# Patient Record
Sex: Male | Born: 1980 | Race: White | Hispanic: No | Marital: Single | Smoking: Current every day smoker
Health system: Southern US, Community
[De-identification: ages and names within clinical notes are randomized; demographics above are authoritative.]

## PROBLEM LIST (undated history)

## (undated) DIAGNOSIS — F909 Attention-deficit hyperactivity disorder, unspecified type: Secondary | ICD-10-CM

---
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
## 2004-08-08 ENCOUNTER — Emergency Department (HOSPITAL_COMMUNITY): Admission: EM | Admit: 2004-08-08 | Discharge: 2004-08-09 | Payer: Self-pay | Admitting: Emergency Medicine

## 2005-03-06 ENCOUNTER — Emergency Department (HOSPITAL_COMMUNITY): Admission: EM | Admit: 2005-03-06 | Discharge: 2005-03-06 | Payer: Self-pay | Admitting: Emergency Medicine

## 2005-05-25 ENCOUNTER — Emergency Department (HOSPITAL_COMMUNITY): Admission: EM | Admit: 2005-05-25 | Discharge: 2005-05-25 | Payer: Self-pay | Admitting: *Deleted

## 2005-11-12 ENCOUNTER — Emergency Department (HOSPITAL_COMMUNITY): Admission: EM | Admit: 2005-11-12 | Discharge: 2005-11-13 | Payer: Self-pay | Admitting: Emergency Medicine

## 2006-02-15 ENCOUNTER — Emergency Department (HOSPITAL_COMMUNITY): Admission: EM | Admit: 2006-02-15 | Discharge: 2006-02-16 | Payer: Self-pay | Admitting: Emergency Medicine

## 2006-07-15 ENCOUNTER — Inpatient Hospital Stay (HOSPITAL_COMMUNITY): Admission: AC | Admit: 2006-07-15 | Discharge: 2006-07-16 | Payer: Self-pay

## 2008-11-29 ENCOUNTER — Emergency Department (HOSPITAL_COMMUNITY): Admission: EM | Admit: 2008-11-29 | Discharge: 2008-11-29 | Payer: Self-pay | Admitting: Family Medicine

## 2012-06-07 ENCOUNTER — Encounter (HOSPITAL_COMMUNITY): Payer: Self-pay

## 2012-06-07 ENCOUNTER — Emergency Department (INDEPENDENT_AMBULATORY_CARE_PROVIDER_SITE_OTHER)
Admission: EM | Admit: 2012-06-07 | Discharge: 2012-06-07 | Disposition: A | Discharge status: Home / Self Care | Payer: BC Managed Care – PPO | Source: Home / Self Care | Attending: Emergency Medicine | Admitting: Emergency Medicine

## 2012-06-07 ENCOUNTER — Emergency Department (INDEPENDENT_AMBULATORY_CARE_PROVIDER_SITE_OTHER): Payer: BC Managed Care – PPO

## 2012-06-07 DIAGNOSIS — S92919A Unspecified fracture of unspecified toe(s), initial encounter for closed fracture: Secondary | ICD-10-CM

## 2012-06-07 MED ORDER — OXYCODONE-ACETAMINOPHEN 5-325 MG PO TABS: 2.0000 | ORAL_TABLET | ORAL | Status: AC | PRN

## 2012-06-07 NOTE — ED Notes (Signed)
States a plate was dropped on his right great toe earlier today; pain , swelling and ecchymosis present

## 2012-06-07 NOTE — ED Provider Notes (Signed)
History     CSN: [id number]  Arrival date & time 06/07/12  1717   First MD Initiated Contact with Patient 06/07/12 1818      Chief Complaint  Patient presents with  . Toe Injury    (Consider location/radiation/quality/duration/timing/severity/associated sxs/prior treatment) Patient is a 31 y.o. male presenting with toe pain. The history is provided by the patient. No language interpreter was used.  Toe Pain This is a new problem. The current episode started yesterday. The problem occurs constantly. The problem has been gradually worsening. The symptoms are aggravated by walking. Nothing relieves the symptoms. He has tried nothing for the symptoms.  Pt complains of pain to his right big toe after dropping a piece of metal on toe  History reviewed. No pertinent past medical history.  History reviewed. No pertinent past surgical history.  History reviewed. No pertinent family history.  History  Substance Use Topics  . Smoking status: Current Everyday Smoker  . Smokeless tobacco: Not on file  . Alcohol Use: Yes      Review of Systems  Musculoskeletal: Positive for joint swelling.  All other systems reviewed and are negative.    Allergies  Vicodin  Home Medications  No current outpatient prescriptions on file.  BP 104/62  Pulse 79  Temp 98 F (36.7 C) (Oral)  Resp 18  SpO2 95%  Physical Exam  Nursing note and vitals reviewed. Constitutional: He is oriented to person, place, and time.  Musculoskeletal: He exhibits tenderness.       Swollen tender right 1st toe,    Neurological: He is alert and oriented to person, place, and time. He has normal reflexes.  Skin: There is erythema.  Psychiatric: He has a normal mood and affect.    ED Course  Procedures (including critical care time)  Labs Reviewed - No data to display No results found.   1. Fracture, toe       MDM  Buddy tape post op shoe        Lonia Skinner [place], [place] 06/07/12 1846  Lonia Skinner  [place], [place] 06/07/12 [phone]

## 2012-06-09 NOTE — ED Provider Notes (Signed)
Medical screening examination/treatment/procedure(s) were performed by non-physician practitioner and as supervising physician I was immediately available for consultation/collaboration.  Gerrald Basu   Despina Boan, MD 06/09/12 1046 

## 2012-08-12 ENCOUNTER — Emergency Department (HOSPITAL_COMMUNITY)
Admission: EM | Admit: 2012-08-12 | Discharge: 2012-08-12 | Disposition: A | Discharge status: Home / Self Care | Payer: BC Managed Care – PPO | Attending: Emergency Medicine | Admitting: Emergency Medicine

## 2012-08-12 ENCOUNTER — Encounter (HOSPITAL_COMMUNITY): Payer: Self-pay | Admitting: *Deleted

## 2012-08-12 DIAGNOSIS — F1110 Opioid abuse, uncomplicated: Secondary | ICD-10-CM | POA: Insufficient documentation

## 2012-08-12 DIAGNOSIS — F1490 Cocaine use, unspecified, uncomplicated: Secondary | ICD-10-CM

## 2012-08-12 DIAGNOSIS — F909 Attention-deficit hyperactivity disorder, unspecified type: Secondary | ICD-10-CM | POA: Insufficient documentation

## 2012-08-12 DIAGNOSIS — F17200 Nicotine dependence, unspecified, uncomplicated: Secondary | ICD-10-CM | POA: Insufficient documentation

## 2012-08-12 DIAGNOSIS — F1310 Sedative, hypnotic or anxiolytic abuse, uncomplicated: Secondary | ICD-10-CM | POA: Insufficient documentation

## 2012-08-12 DIAGNOSIS — F1410 Cocaine abuse, uncomplicated: Secondary | ICD-10-CM | POA: Insufficient documentation

## 2012-08-12 HISTORY — DX: Attention-deficit hyperactivity disorder, unspecified type: F90.9

## 2012-08-12 LAB — BASIC METABOLIC PANEL
BUN: 20 mg/dL (ref 6–23)
Chloride: 105 mEq/L (ref 96–112)
Creatinine, Ser: 0.88 mg/dL (ref 0.50–1.35)
GFR calc Af Amer: >90 mL/min (ref >90)
GFR calc non Af Amer: >90 mL/min (ref >90)

## 2012-08-12 LAB — RAPID URINE DRUG SCREEN, HOSP PERFORMED
Amphetamines: NOT DETECTED
Benzodiazepines: NOT DETECTED
Cocaine: POSITIVE — AB
Opiates: NOT DETECTED

## 2012-08-12 LAB — ETHANOL: Alcohol, Ethyl (B): <11 mg/dL (ref 0–11)

## 2012-08-12 LAB — CBC WITH DIFFERENTIAL/PLATELET
Basophils Absolute: 0 10*3/uL (ref 0.0–0.1)
Basophils Relative: 0 % (ref 0–1)
Lymphocytes Relative: 29 % (ref 12–46)
MCHC: 33.5 g/dL (ref 30.0–36.0)
Neutro Abs: 4.9 10*3/uL (ref 1.7–7.7)
Neutrophils Relative %: 59 % (ref 43–77)
Platelets: 198 10*3/uL (ref 150–400)
RDW: 13 % (ref 11.5–15.5)
WBC: 8.3 10*3/uL (ref 4.0–10.5)

## 2012-08-12 LAB — TROPONIN I: Troponin I: <0.3 ng/mL (ref <0.30)

## 2012-08-12 LAB — POCT I-STAT TROPONIN I: Troponin i, poc: 0.02 ng/mL (ref 0.00–0.08)

## 2012-08-12 MED ORDER — NICOTINE 21 MG/24HR TD PT24: 21.0000 mg | MEDICATED_PATCH | Freq: Every day | TRANSDERMAL | Status: DC

## 2012-08-12 MED ORDER — IBUPROFEN 200 MG PO TABS: 600.0000 mg | ORAL_TABLET | Freq: Three times a day (TID) | ORAL | Status: DC | PRN

## 2012-08-12 MED ORDER — ACETAMINOPHEN 325 MG PO TABS
650.0000 mg | ORAL_TABLET | ORAL | Status: DC | PRN
Start: 2012-08-12 — End: 2012-08-13

## 2012-08-12 MED ORDER — ZOLPIDEM TARTRATE 5 MG PO TABS: 5.0000 mg | ORAL_TABLET | Freq: Every evening | ORAL | Status: DC | PRN

## 2012-08-12 MED ORDER — ONDANSETRON HCL 8 MG PO TABS: 4.0000 mg | ORAL_TABLET | Freq: Three times a day (TID) | ORAL | Status: DC | PRN

## 2012-08-12 NOTE — ED Notes (Signed)
Regular Diet Ordered spoke with Monica  

## 2012-08-12 NOTE — ED Notes (Signed)
Pt placed in paper scrubs. All of pt's belongings given to mother to take home. Nothing left here with pt in ED

## 2012-08-12 NOTE — ED Notes (Signed)
Pt denies si/hi and is here for medical clearance to go to [facility] to detox from crack cocaine which last use was Friday and detox from Xanax ( Last use Friday), last use on percocet Thursday.

## 2012-08-12 NOTE — ED Notes (Signed)
Pt states he came to the ED for medical clearance so he can start detox program.

## 2012-08-12 NOTE — ED Provider Notes (Signed)
Medical screening examination/treatment/procedure(s) were performed by non-physician practitioner and as supervising physician I was immediately available for consultation/collaboration.  Everette Dimauro, MD 08/12/12 2329 

## 2012-08-12 NOTE — ED Notes (Signed)
Pt A.O. X. 4. Denies SI. Denies HI. Denies Auditory and visual hallucinations. Denies pain. Denies SOB. Respirations even and regular. Vitals stable. Skin warm and dry. NAD. States his belonging were sent home with his mother prior to rooming in [facility]. Verbalized understanding of discharge instructions. Able to locate information needed to follow up with [facility]. No further questions at this time.

## 2012-08-12 NOTE — ED Notes (Signed)
Requesting detox from crack/cocaine, xanax & percocet. Daily crack use x 4 months, avg "4 rocks" daily. Average xanax & percocet use was twice weekly. Denies ETOH abuse. Reports last used everything on Friday. States spoke with [facility] &, was instructed to go to ED for medical clearance. Denies SI, HI, has never been to detox/rehab before.  States had SSCP for approx 45 min which resolved on its own shortly upon arrival to ED. Denied any SOB, n/v, diaphoresis with CP.

## 2012-08-12 NOTE — ED Provider Notes (Signed)
History     CSN: [id number]  Arrival date & time 08/12/12  1257   First MD Initiated Contact with Patient 08/12/12 1612      No chief complaint on file.   (Consider location/radiation/quality/duration/timing/severity/associated sxs/prior treatment) HPI  Pt to the ER from [facility] requesting detox for crack cocaine. He uses percocet's and xanax but infrequently. His last use of cocaine was on Friday. He used everyday for the past 4 months. He denies SI/HI. No ETOH abuse.  Upon arrival he had a chest pain that lasted 30 minutes and was substernal. He said that he has had this before when anxious. He denies any N,V,D, or diaphoresis, weakness or loc. He is currently pain free.    History reviewed. No pertinent past medical history.  History reviewed. No pertinent past surgical history.  No family history on file.  History  Substance Use Topics  . Smoking status: Current Every Day Smoker  . Smokeless tobacco: Not on file  . Alcohol Use: Yes     occ      Review of Systems  Review of Systems  Gen: no weight loss, fevers, chills, night sweats  Eyes: no discharge or drainage, no occular pain or visual changes  Nose: no epistaxis or rhinorrhea  Mouth: no dental pain, no sore throat  Neck: no neck pain  Lungs:No wheezing, coughing or hemoptysis CV: no chest pain, palpitations, dependent edema or orthopnea  Abd: no abdominal pain, nausea, vomiting  GU: no dysuria or gross hematuria  MSK:  No abnormalities  Neuro: no headache, no focal neurologic deficits  Skin: no abnormalities Psyche: substance abuse   Allergies  Vicodin  Home Medications  No current outpatient prescriptions on file.  BP 136/66  Pulse 94  Temp 98.6 F (37 C) (Oral)  Resp 18  SpO2 99%  Physical Exam  Nursing note and vitals reviewed. Constitutional: He appears well-developed and well-nourished. No distress.  HENT:  Head: Normocephalic and atraumatic.  Eyes: Pupils are equal, round, and  reactive to light.  Neck: Normal range of motion. Neck supple.  Cardiovascular: Normal rate and regular rhythm.   Pulmonary/Chest: Effort normal.  Abdominal: Soft.  Neurological: He is alert.  Skin: Skin is warm and dry.  Psychiatric: He expresses no homicidal and no suicidal ideation. He expresses no suicidal plans and no homicidal plans.    ED Course  Procedures (including critical care time)  Labs Reviewed  URINE RAPID DRUG SCREEN (HOSP PERFORMED) - Abnormal; Notable for the following:    Cocaine POSITIVE (*)     All other components within normal limits  SALICYLATE LEVEL - Abnormal; Notable for the following:    Salicylate Lvl <2.0 (*)     All other components within normal limits  BASIC METABOLIC PANEL  CBC WITH DIFFERENTIAL  ETHANOL  ACETAMINOPHEN LEVEL  POCT I-STAT TROPONIN I  TROPONIN I   No results found.   1. Crack cocaine use       MDM   Date: 08/12/2012  Rate: 86  Rhythm: normal sinus rhythm  QRS Axis: normal  Intervals: PR shortened  ST/T Wave abnormalities: normal  Conduction Disutrbances:incomplete right bundle branch block  Narrative Interpretation:   Old EKG Reviewed: none available    Pt medically cleared. Two negative Troponins. ACT team consulted. Holding orders placed.          Dorthula Matas, PA 08/12/12 1724

## 2012-08-12 NOTE — BH Assessment (Signed)
Assessment Note   Chris Cook is an 31 y.o. male.  Patient came to [facility] seeking medical clearance prior to applying for a rehabilitation bed at [facility].  Patient said that he had called [facility] earlier in the day and was told he would need medical clearance.  Patient denies any HI, SI or A/V hallucinations.  He does report daily use of crack over the last 3-4 months.  He is spending an average of $100 per day on it with last use being 10/25.  Patient also reports using pain pills (xanax, percocets, oxycodone) use at least 3x/W.  Patient reports last use was 10/25.  Patient has not had previous inpatient care.  He has had outpatient psychiatric care which was court ordered about two years ago.  Pt cannot recall name of provider at that time.  This clinician called [facility] and was informed that patient can call them in the morning and ask to speak to [employer].  This was discussed with Dr. Nino Parsley [facility]) who agreed that patient did not meet inpatient mental health criteria and could be discharged home.  Patient was fine with going home and will contact [facility] in the AM tomorrow.  Patient was given contact information for [facility].  Patient discharged home. Axis I: Substance Abuse Axis II: Deferred Axis III:  Past Medical History  Diagnosis Date  . ADHD (attention deficit hyperactivity disorder)    Axis IV: problems related to legal system/crime Axis V: 51-60 moderate symptoms  Past Medical History:  Past Medical History  Diagnosis Date  . ADHD (attention deficit hyperactivity disorder)     History reviewed. No pertinent past surgical history.  Family History: History reviewed. No pertinent family history.  Social History:  reports that he has been smoking.  He does not have any smokeless tobacco history on file. He reports that he drinks alcohol. He reports that he uses illicit drugs (Cocaine).  Additional Social History:  Alcohol / Drug Use Pain Medications: Patient admits to abusing xanax,  percocets and oxycodone Prescriptions: None at this time Over the Counter: N/A History of alcohol / drug use?: Yes Substance #1 Name of Substance 1: Pain pills (xanax, percocets & oxycodone) 1 - Age of First Use: 31 years old 1 - Amount (size/oz): Reports that he takes 1-2 pills daily (14 mg approximately daily) 1 - Frequency: Daily use 1 - Duration: Over the last 3-4 months 1 - Last Use / Amount: 10/25 in the evening used 3 xanax Substance #2 Name of Substance 2: Crack cocaine 2 - Age of First Use: 31 years of age 26 - Amount (size/oz): uses around 4 rocks, or $100 per day. 2 - Frequency: Daily use  2 - Duration: Over the last 3-4 months 2 - Last Use / Amount: 10/25 in the evening.  CIWA: CIWA-Ar BP: 100/59 mmHg Pulse Rate: 77  COWS:    Allergies:  Allergies  Allergen Reactions  . Vicodin (Hydrocodone-Acetaminophen) Itching and Nausea And Vomiting    Home Medications:  (Not in a hospital admission)  OB/GYN Status:  No LMP for male patient.  General Assessment Data Location of Assessment: [facility] ED Living Arrangements: Parent (staying with mother) Can pt return to current living arrangement?: Yes Admission Status: Voluntary Is patient capable of signing voluntary admission?: Yes Transfer from: Acute Hospital Referral Source: Self/Family/Friend  Education Status Highest grade of school patient has completed: 1.5 years of technical school  Risk to self Suicidal Ideation: No Suicidal Intent: No Is patient at risk for suicide?:  No Suicidal Plan?: No Access to Means: No What has been your use of drugs/alcohol within the last 12 months?: Regular use of opiates and crack Previous Attempts/Gestures: Yes How many times?: 1  Other Self Harm Risks: On-going SA issues Triggers for Past Attempts:  (Conflict with baby's mamma.) Intentional Self Injurious Behavior: None Family Suicide History: No Recent stressful life event(s): Legal Issues Persecutory voices/beliefs?:  No Depression: Yes Depression Symptoms: Isolating;Feeling worthless/self pity Substance abuse history and/or treatment for substance abuse?: Yes Suicide prevention information given to non-admitted patients: Not applicable  Risk to Others Homicidal Ideation: No Thoughts of Harm to Others: No Current Homicidal Intent: No Current Homicidal Plan: No Access to Homicidal Means: No Identified Victim: No one History of harm to others?: No Assessment of Violence: None Noted Violent Behavior Description: Pt calm and cooperative Does patient have access to weapons?: No Criminal Charges Pending?: Yes Describe Pending Criminal Charges: Possession of stolen goods and paraphenalia Does patient have a court date: Yes Court Date:  (November date for paraphenalia)  Psychosis Hallucinations: None noted Delusions: None noted  Mental Status Report Appear/Hygiene:  (Casual) Eye Contact: Fair Motor Activity: Freedom of movement;Unremarkable Speech: Logical/coherent Level of Consciousness: Alert Mood: Depressed Affect: Anxious Anxiety Level: Minimal Thought Processes: Coherent;Relevant Judgement: Unimpaired Orientation: Person;Place;Time;Situation Obsessive Compulsive Thoughts/Behaviors: None  Cognitive Functioning Concentration: Decreased Memory: Remote Impaired;Recent Intact IQ: Average Insight: Fair Impulse Control: Poor Appetite: Fair Weight Loss: 15  Weight Gain: 0  Sleep: No Change Total Hours of Sleep: 8  Vegetative Symptoms: Staying in bed  ADLScreening [facility]) Patient's cognitive ability adequate to safely complete daily activities?: Yes Patient able to express need for assistance with ADLs?: Yes Independently performs ADLs?: Yes (appropriate for developmental age)  Abuse/Neglect [facility]) Physical Abuse: Denies Verbal Abuse: Denies Sexual Abuse: Denies  Prior Inpatient Therapy Prior Inpatient Therapy: No Prior Therapy Dates: N/A Prior Therapy  Facilty/Provider(s): N/A Reason for Treatment: N/A  Prior Outpatient Therapy Prior Outpatient Therapy: Yes Prior Therapy Dates: 2 years ago Prior Therapy Facilty/Provider(s): Pt cannot recall provider Reason for Treatment: Court ordered  ADL Screening (condition at time of admission) Patient's cognitive ability adequate to safely complete daily activities?: Yes Patient able to express need for assistance with ADLs?: Yes Independently performs ADLs?: Yes (appropriate for developmental age) Weakness of Legs: None Weakness of Arms/Hands: None  Home Assistive Devices/Equipment Home Assistive Devices/Equipment: None    Abuse/Neglect Assessment (Assessment to be complete while patient is alone) Physical Abuse: Denies Verbal Abuse: Denies Sexual Abuse: Denies Exploitation of patient/patient's resources: Denies Self-Neglect: Denies Values / Beliefs Cultural Requests During Hospitalization: None Spiritual Requests During Hospitalization: None   Advance Directives (For Healthcare) Advance Directive: Patient does not have advance directive;Patient would not like information    Additional Information 1:1 In Past 12 Months?: No CIRT Risk: No Elopement Risk: No Does patient have medical clearance?: Yes     Disposition:  Disposition Disposition of Patient: Outpatient treatment;Referred to Type of outpatient treatment: Adult Patient referred to: [facility]  On Site Evaluation by:   Reviewed with Physician:  Dr. Marya Fossa, Berna Spare Ray 08/12/2012 9:22 PM

## 2012-08-12 NOTE — ED Notes (Signed)
Report received from Monica, RN.

## 2012-08-12 NOTE — ED Notes (Signed)
Contact numbers Silvio Pate (mother) Home: [phone] Cell: [phone] Work: [phone]

## 2012-08-12 NOTE — ED Notes (Signed)
Mother Chris Cook called to pick her son up. After discharge.

## 2012-08-12 NOTE — ED Notes (Signed)
Pt reports chest pain that started one hour ago to left chest and no radiation

## 2012-08-12 NOTE — ED Notes (Signed)
Berna Spare, ACT Team at bedside.

## 2012-08-12 NOTE — ED Notes (Signed)
Pt A.O. X 4. Sitting up in bed watching tv. Respirations even and regular. Skin warm and dry. Denies pain. Denies N/V/D/C. Denies anxiety. Denies Visual or auditory hallucinations. No further needs at this time.

## 2012-09-02 ENCOUNTER — Ambulatory Visit (HOSPITAL_COMMUNITY)
Admission: RE | Admit: 2012-09-02 | Discharge: 2012-09-02 | Disposition: A | Discharge status: Home / Self Care | Payer: PRIVATE HEALTH INSURANCE | Attending: Psychiatry | Admitting: Psychiatry

## 2012-09-02 ENCOUNTER — Other Ambulatory Visit (HOSPITAL_COMMUNITY): Payer: PRIVATE HEALTH INSURANCE | Attending: Psychiatry | Admitting: Psychology

## 2012-09-02 ENCOUNTER — Encounter (HOSPITAL_COMMUNITY): Payer: Self-pay | Admitting: Psychology

## 2012-09-02 DIAGNOSIS — F1420 Cocaine dependence, uncomplicated: Secondary | ICD-10-CM | POA: Insufficient documentation

## 2012-09-02 DIAGNOSIS — F1310 Sedative, hypnotic or anxiolytic abuse, uncomplicated: Secondary | ICD-10-CM

## 2012-09-02 DIAGNOSIS — F1110 Opioid abuse, uncomplicated: Secondary | ICD-10-CM | POA: Insufficient documentation

## 2012-09-02 DIAGNOSIS — F909 Attention-deficit hyperactivity disorder, unspecified type: Secondary | ICD-10-CM | POA: Insufficient documentation

## 2012-09-02 DIAGNOSIS — F1410 Cocaine abuse, uncomplicated: Secondary | ICD-10-CM | POA: Insufficient documentation

## 2012-09-02 NOTE — BH Assessment (Signed)
Assessment Note   Chris Cook is an 31 y.o. male that presented alone requesting admission to [facility] IOP for additional sa treatment. Pt was oriented x's 4, calm, cooperative and Pt was released from [facility] (08/14/12-08/28/12) after 14 days of treatment and decided that he "want more treatment" and stability in his recovery. Pt confirms his sa hx includes cocaine (crack), opioids (xanax, percocet, klonipin), cannabis, etoh, nicotine (1pk daily). He denies SI, HI, A/V hallucinations, delusions or psychosis, as well as any sexual, physical or emotional abuse. Pt confirms that his recent stressors are that he has only used substances for the last 4 months (in his life) and the using is effecting his relationships (creating distance) and finances "I stopped paying my bills", legal (stolen prop & paraphernalia). Pt reports that his dad is nearing the end of his life battleing throat and lung ca; and still dad, mom and older sister are his natural supports. Pt reports his only withdrawal symptom is tremors (in his hands).   Axis I: 305.60 Cocaine Abuse Axis II: Deferred Axis III:  Past Medical History  Diagnosis Date  . ADHD (attention deficit hyperactivity disorder)    Axis IV: economic problems, problems related to legal system/crime, problems related to social environment and problems with primary support group Axis V: 41-50 serious symptoms  Past Medical History:  Past Medical History  Diagnosis Date  . ADHD (attention deficit hyperactivity disorder)     No past surgical history on file.  Family History: No family history on file.  Social History:  reports that he has been smoking.  He does not have any smokeless tobacco history on file. He reports that he drinks alcohol. He reports that he uses illicit drugs (Cocaine).  Additional Social History:  Alcohol / Drug Use Pain Medications: Xanax, Percocets, Klonipine Prescriptions: none Over the Counter: none History of alcohol / drug use?:  Yes Negative Consequences of Use: Financial;Personal relationships (pt reports not paying bills, relationships effected) Withdrawal Symptoms: Tremors (pt tremors in hands) Substance #1 Name of Substance 1: Xanax, Percocet, Klonipine 1 - Age of First Use: 30 years 1 - Amount (size/oz): 1-2 pills daily 1 - Frequency: daily 1 - Duration: last 4 months 1 - Last Use / Amount: unk Substance #2 Name of Substance 2: crack cocaine 2 - Age of First Use: 30 years 2 - Amount (size/oz): 4-5 rocks 2 - Frequency: daily 2 - Duration: 4 months  CIWA: CIWA-Ar Nausea and Vomiting: no nausea and no vomiting Tactile Disturbances: none Tremor: two Auditory Disturbances: not present Paroxysmal Sweats: no sweat visible Visual Disturbances: not present Anxiety: mildly anxious Headache, Fullness in Head: none present Agitation: normal activity Orientation and Clouding of Sensorium: oriented and can do serial additions CIWA-Ar Total: 3  COWS: Clinical Opiate Withdrawal Scale (COWS) Resting Pulse Rate: Pulse Rate 80 or below Sweating: No report of chills or flushing Restlessness: Able to sit still Pupil Size: Pupils pinned or normal size for room light Bone or Joint Aches: Not present Runny Nose or Tearing: Not present GI Upset: No GI symptoms Tremor: Tremor can be felt, but not observed Yawning: No yawning Anxiety or Irritability: None Gooseflesh Skin: Skin is smooth COWS Total Score: 1   Allergies:  Allergies  Allergen Reactions  . Vicodin (Hydrocodone-Acetaminophen) Itching and Nausea And Vomiting    Home Medications:  (Not in a hospital admission)  OB/GYN Status:  No LMP for male patient.  General Assessment Data Location of Assessment: [facility] Assessment Services Living Arrangements: Parent  Can pt return to current living arrangement?: Yes Admission Status: Voluntary Is patient capable of signing voluntary admission?: Yes Transfer from: Home Referral Source:  Self/Family/Friend  Education Status Is patient currently in school?: No  Risk to self Suicidal Ideation: No Suicidal Intent: No Is patient at risk for suicide?: No Suicidal Plan?: No Access to Means: No What has been your use of drugs/alcohol within the last 12 months?: opiates and crack Previous Attempts/Gestures: No How many times?:  (0) Other Self Harm Risks:  (SA issues) Triggers for Past Attempts: Other personal contacts Intentional Self Injurious Behavior: None Family Suicide History: No Recent stressful life event(s): Loss (Comment);Financial Problems (dad near end of life (throat & lung ca), not paying bills) Persecutory voices/beliefs?: No Depression: No Depression Symptoms: Loss of interest in usual pleasures (pt denies any other symptoms) Substance abuse history and/or treatment for substance abuse?: Yes Suicide prevention information given to non-admitted patients: Not applicable  Risk to Others Homicidal Ideation: No Thoughts of Harm to Others: No Current Homicidal Intent: No Current Homicidal Plan: No Access to Homicidal Means: No Identified Victim:  (none) History of harm to others?: No Assessment of Violence: None Noted Violent Behavior Description:  (calm and cooperative) Does patient have access to weapons?: No Criminal Charges Pending?: Yes Describe Pending Criminal Charges:  (poss of stolen goods and drug paraphenalia) Does patient have a court date: Yes Court Date:  (pt reports that it's taken care of as of 09/02/12 )  Psychosis Hallucinations: None noted Delusions: None noted  Mental Status Report Appear/Hygiene:  (appropriate) Eye Contact: Fair Motor Activity: Freedom of movement Speech: Logical/coherent Level of Consciousness: Alert Mood: Depressed Affect: Appropriate to circumstance Anxiety Level: Minimal Thought Processes: Coherent;Relevant Judgement: Unimpaired Orientation: Person;Place;Time;Situation Obsessive Compulsive  Thoughts/Behaviors: None  Cognitive Functioning Concentration: Decreased Memory: Remote Intact;Recent Impaired IQ: Average Insight: Fair Impulse Control: Poor Appetite: Good Weight Loss:  (0) Weight Gain:  (0) Sleep: No Change Total Hours of Sleep:  (8) Vegetative Symptoms: None  ADLScreening [facility] Assessment Services) Patient's cognitive ability adequate to safely complete daily activities?: Yes Patient able to express need for assistance with ADLs?: Yes Independently performs ADLs?: Yes (appropriate for developmental age)  Abuse/Neglect [facility]) Physical Abuse: Denies Verbal Abuse: Denies Sexual Abuse: Denies  Prior Inpatient Therapy Prior Inpatient Therapy: Yes Prior Therapy Dates:  (08/14/12-08/28/12) Prior Therapy Facilty/Provider(s):  ([facility]) Reason for Treatment:  (cocaine, opiates)  Prior Outpatient Therapy Prior Outpatient Therapy: Yes Prior Therapy Dates: 2 years ago Prior Therapy Facilty/Provider(s): Pt cannot recall provider Reason for Treatment: Court ordered  ADL Screening (condition at time of admission) Patient's cognitive ability adequate to safely complete daily activities?: Yes Patient able to express need for assistance with ADLs?: Yes Independently performs ADLs?: Yes (appropriate for developmental age) Weakness of Legs: None Weakness of Arms/Hands: None  Home Assistive Devices/Equipment Home Assistive Devices/Equipment: None  Therapy Consults (therapy consults require a physician order) PT Evaluation Needed: No OT Evalulation Needed: No SLP Evaluation Needed: No Abuse/Neglect Assessment (Assessment to be complete while patient is alone) Physical Abuse: Denies Verbal Abuse: Denies Sexual Abuse: Denies Exploitation of patient/patient's resources: Denies Self-Neglect: Denies Values / Beliefs Cultural Requests During Hospitalization: None Spiritual Requests During Hospitalization: None Consults Spiritual Care Consult Needed: No Social Work  Consult Needed: No Merchant navy officer (For Healthcare) Advance Directive: Patient does not have advance directive Pre-existing out of facility DNR order (yellow form or pink MOST form): No Nutrition Screen- [facility] Adult/WL/AP Patient's home diet: Regular Have you recently lost weight without trying?: No  Have you been eating poorly because of a decreased appetite?: No Malnutrition Screening Tool Score: 0   Additional Information 1:1 In Past 12 Months?: No CIRT Risk: No Elopement Risk: No Does patient have medical clearance?: No     Disposition: Pt accepted to [facility] IOP, starting date 09/02/12 at 1:00 pm, with Charmian Muff, LCAS, NCC, MA. Disposition Disposition of Patient: Outpatient treatment Type of outpatient treatment: Adult Patient referred to: Other (Comment) ([facility] CD IOP)  On Site Evaluation by:   Reviewed with Physician:     Ranae Pila A 09/02/2012 1:01 PM

## 2012-09-03 ENCOUNTER — Encounter (HOSPITAL_COMMUNITY): Payer: Self-pay | Admitting: Psychology

## 2012-09-04 ENCOUNTER — Other Ambulatory Visit (HOSPITAL_COMMUNITY): Payer: PRIVATE HEALTH INSURANCE | Admitting: Psychology

## 2012-09-04 DIAGNOSIS — F1420 Cocaine dependence, uncomplicated: Secondary | ICD-10-CM

## 2012-09-04 DIAGNOSIS — F1310 Sedative, hypnotic or anxiolytic abuse, uncomplicated: Secondary | ICD-10-CM | POA: Insufficient documentation

## 2012-09-04 DIAGNOSIS — F1110 Opioid abuse, uncomplicated: Secondary | ICD-10-CM | POA: Insufficient documentation

## 2012-09-04 DIAGNOSIS — F1410 Cocaine abuse, uncomplicated: Secondary | ICD-10-CM | POA: Insufficient documentation

## 2012-09-04 DIAGNOSIS — F1920 Other psychoactive substance dependence, uncomplicated: Secondary | ICD-10-CM

## 2012-09-04 NOTE — Progress Notes (Unsigned)
    Daily Group Progress Note  Program: CD-IOP   Group Time: 1-2:30 pm  Participation Level: Minimal  Behavioral Response: Sharing  Type of Therapy: Process Group  Topic: Group Process/Guest Speaker: Group began with a brief check-in. Members shared about the past weekend and what actions they did to support their recovery.  While some had attended 12-step meetings, others did not. Those that did not attend any meetings had plenty of excuses. A guest speaker arrived in the form of a previous group member who had graduated from the program some time ago. He joined the session and described what he had done once he recognized his drinking was out of control. He was extremely open and honest about his addiction and pointed out the mistakes he had made in early recovery. The patient displayed a very good understanding of the concepts behind the 12-steps and encouraged group members to attend meetings, secure a sponsor and read the Big Book. His visit proved very inspiring and encouraging to the group.   Group Time: 2:45- 4pm  Participation Level: Minimal  Behavioral Response: Sharing  Type of Therapy: Psycho-education Group  Topic: Codependence - Part I: A presentation was provided on the issue of codependence. Handouts were provided and the group read examples of codependent behaviors and shared their own history and codependent behaviors. There were many 'confessions' of insecurity, mind-reading, and destructive sexual patterns disclosed. Almost everyone agreed that they had often confused love and sex. There was a good discussion among group members and the session proved very informative.   Summary: The patient was new to the group and explained he had been abusing drugs. He reported he had gone into [facility] on October 30th and gotten out on the 13th. He admitted he had had alcohol on the 16th. His sobriety date is 11/17. The patient was quiet and reserved for most of the remainder of the  session. He had told me in the orientation that he isn't very talkative. He was attentive during the visiting speaker's comments. He did well for this first group session considering he is very shy. He will be invited to share more in the sessions to come.     Family Program: Family present? No   Name of family member(s):   UDS collected: No Results:   AA/NA attended?: No, patient is new to the group  Sponsor?: No   Olliver Boyadjian, LCAS

## 2012-09-05 ENCOUNTER — Encounter (HOSPITAL_COMMUNITY): Payer: Self-pay | Admitting: Psychology

## 2012-09-05 DIAGNOSIS — F1420 Cocaine dependence, uncomplicated: Secondary | ICD-10-CM | POA: Insufficient documentation

## 2012-09-05 LAB — PRESCRIPTION ABUSE MONITORING 17P, URINE
Amphetamine/Meth: NEGATIVE ng/mL
Barbiturate Screen, Urine: NEGATIVE ng/mL
Buprenorphine, Urine: NEGATIVE ng/mL
Cannabinoid Scrn, Ur: NEGATIVE ng/mL
Carisoprodol, Urine: NEGATIVE ng/mL
Cocaine Metabolites: NEGATIVE ng/mL
Fentanyl, Ur: NEGATIVE ng/mL
Opiate Screen, Urine: NEGATIVE ng/mL
Oxycodone Screen, Ur: NEGATIVE ng/mL

## 2012-09-05 LAB — ALCOHOL METABOLITE (ETG), URINE: Ethyl Glucuronide (EtG): NEGATIVE ng/mL
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
## 2012-09-06 ENCOUNTER — Encounter (HOSPITAL_COMMUNITY): Payer: Self-pay | Admitting: Psychology

## 2012-09-06 ENCOUNTER — Other Ambulatory Visit (HOSPITAL_COMMUNITY): Payer: PRIVATE HEALTH INSURANCE | Admitting: Psychology

## 2012-09-06 DIAGNOSIS — F1420 Cocaine dependence, uncomplicated: Secondary | ICD-10-CM

## 2012-09-06 MED ORDER — SERTRALINE HCL 50 MG PO TABS: ORAL_TABLET | ORAL | Status: AC
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
## 2012-09-09 ENCOUNTER — Other Ambulatory Visit (HOSPITAL_COMMUNITY): Payer: PRIVATE HEALTH INSURANCE | Admitting: Psychology

## 2012-09-09 DIAGNOSIS — F1420 Cocaine dependence, uncomplicated: Secondary | ICD-10-CM

## 2012-09-11 ENCOUNTER — Other Ambulatory Visit (HOSPITAL_COMMUNITY): Payer: PRIVATE HEALTH INSURANCE

## 2012-09-13 ENCOUNTER — Other Ambulatory Visit (HOSPITAL_COMMUNITY): Payer: PRIVATE HEALTH INSURANCE

## 2012-09-16 ENCOUNTER — Other Ambulatory Visit (HOSPITAL_COMMUNITY): Payer: PRIVATE HEALTH INSURANCE | Attending: Psychiatry | Admitting: Psychology

## 2012-09-16 DIAGNOSIS — F1420 Cocaine dependence, uncomplicated: Secondary | ICD-10-CM

## 2012-09-16 DIAGNOSIS — F1110 Opioid abuse, uncomplicated: Secondary | ICD-10-CM | POA: Insufficient documentation

## 2012-09-16 DIAGNOSIS — F1920 Other psychoactive substance dependence, uncomplicated: Secondary | ICD-10-CM

## 2012-09-16 DIAGNOSIS — F1310 Sedative, hypnotic or anxiolytic abuse, uncomplicated: Secondary | ICD-10-CM | POA: Insufficient documentation

## 2012-09-17 ENCOUNTER — Encounter (HOSPITAL_COMMUNITY): Payer: Self-pay | Admitting: Psychology

## 2012-09-17 LAB — PRESCRIPTION ABUSE MONITORING 17P, URINE
Amphetamine/Meth: NEGATIVE ng/mL
Benzodiazepine Screen, Urine: NEGATIVE ng/mL
Buprenorphine, Urine: NEGATIVE ng/mL
Cannabinoid Scrn, Ur: NEGATIVE ng/mL
Carisoprodol, Urine: NEGATIVE ng/mL
Cocaine Metabolites: NEGATIVE ng/mL
Fentanyl, Ur: NEGATIVE ng/mL
Meperidine, Ur: NEGATIVE ng/mL
Tapentadol, urine: NEGATIVE ng/mL
Tramadol Scrn, Ur: NEGATIVE ng/mL

## 2012-09-17 LAB — ALCOHOL METABOLITE (ETG), URINE: Ethyl Glucuronide (EtG): NEGATIVE ng/mL

## 2012-09-17 NOTE — Progress Notes (Signed)
    Daily Group Progress Note  Program: CD-IOP   Group Time: 1-2:30 pm  Participation Level: Minimal  Behavioral Response: only shared when questioned  Type of Therapy: Process Group  Topic: Group Process: First part of group was spent in process. Members shared about the past holiday weekend and the ways they practiced new coping skills in order to remain alcohol and drug-free. They recounted the frustrations and temptations in early recovery. One member had a new sobriety date and shared about her relapse on cannabis. This relapse was processed with discussion on what she might have done instead of smoked.  There was good disclosure among group members and constructive feedback provided.   Group Time: 2:45- 4pm  Participation Level: Minimal  Behavioral Response: patient does not voluntarily offer information about himself   Type of Therapy: Psycho-education Group  Topic: Resentments: A psycho-ed piece on resentments was provided. Handouts were distributed and a member read from the handouts. The importance of addressing anger and hurt feelings was emphasized since resentments develop out of unresolved anger and bad feelings. Members shared how there are benefits as well as losses associated with resentments. Members talked about some of their resentments and the group discussed how they might go about addressing their resentments.   Summary: The patient reported he had spent most of the weekend at his father's house. He noted that his father has lung cancer. Upon further questioning, he reported he had gone deer hunting and had gotten a deer. His uncle was going to can the meat. The patient shared nothing else of his life or feelings. I questioned his failure to share that he had a daughter. I had read the notes from last week written by the substitute counselor. The patient reported he had an 30 yo daughter named Torie, but he had not been able to see her this holiday. He displays poor  insight and even less motivation. It does not appear that this group member is appropriate based on his lack of engagement despite repeated instructions to share about himself.   Family Program: Family present? No   Name of family member(s):   UDS collected: Yes Results: negative  AA/NA attended?: No, not since last week  Sponsor?: No   Cordon Gassett, LCAS

## 2012-09-18 ENCOUNTER — Other Ambulatory Visit (HOSPITAL_COMMUNITY): Payer: PRIVATE HEALTH INSURANCE

## 2012-09-18 ENCOUNTER — Encounter (HOSPITAL_COMMUNITY): Payer: Self-pay | Admitting: Psychology

## 2012-09-19 ENCOUNTER — Ambulatory Visit (HOSPITAL_COMMUNITY): Payer: BC Managed Care – PPO | Admitting: Psychiatry

## 2012-09-19 NOTE — Progress Notes (Unsigned)
Patient ID: Chris Cook, male   DOB: 1980/12/14, 31 y.o.   MRN: [id number] Patient was discharged from the CD-IOP today. Please see discharge summary and plan in Epic. Charmian Muff, LCAS

## 2012-09-20 ENCOUNTER — Other Ambulatory Visit (HOSPITAL_COMMUNITY): Payer: PRIVATE HEALTH INSURANCE

## 2012-09-20 ENCOUNTER — Encounter (HOSPITAL_COMMUNITY): Payer: Self-pay | Admitting: Psychology

## 2012-09-20 NOTE — Progress Notes (Unsigned)
    Daily Group Progress Note  Program: CD-IOP   Group Time: 1-2:30 pm  Participation Level: Minimal  Behavioral Response: unwilling to share about himself  Type of Therapy: Process Group  Topic: Group Process: first half of group was spent in process. The session was facilitated by a guest counselor Boneta Lucks) and these patient notes are taken from her written correspondence.   Group Time: 2:45- 4pm  Participation Level: None  Behavioral Response: Resistant  Type of Therapy: Psycho-education Group  Topic: Forgiveness: a handout was provided on forgiveness. Members shared about the feelings they have kept in and the subsequent resentments that have collected.  In addition to forgiving others, there was a lengthy discussion on forgiving one's self and its importance in early recovery.   Summary: The patient was very guarded and resistant to efforts made by counselor and other group members to join the group. He spoke very generally about spending time with his daughter, but offered very little and was quiet unless questioned. In the second half of group, the patient was attentive during discussion about forgiveness, but did not contribute.   Family Program: Family present? No   Name of family member(s):   UDS collected: No Results:  AA/NA attended?: No  Sponsor?: No   Roizy Harold, LCAS

## 2012-09-23 ENCOUNTER — Other Ambulatory Visit (HOSPITAL_COMMUNITY): Payer: PRIVATE HEALTH INSURANCE

## 2012-09-25 ENCOUNTER — Other Ambulatory Visit (HOSPITAL_COMMUNITY): Payer: PRIVATE HEALTH INSURANCE

## 2012-09-27 ENCOUNTER — Other Ambulatory Visit (HOSPITAL_COMMUNITY): Payer: PRIVATE HEALTH INSURANCE

## 2012-09-30 ENCOUNTER — Other Ambulatory Visit (HOSPITAL_COMMUNITY): Payer: PRIVATE HEALTH INSURANCE

## 2012-10-02 ENCOUNTER — Other Ambulatory Visit (HOSPITAL_COMMUNITY): Payer: PRIVATE HEALTH INSURANCE

## 2012-10-04 ENCOUNTER — Other Ambulatory Visit (HOSPITAL_COMMUNITY): Payer: PRIVATE HEALTH INSURANCE

## 2012-10-07 ENCOUNTER — Other Ambulatory Visit (HOSPITAL_COMMUNITY): Payer: PRIVATE HEALTH INSURANCE

## 2012-10-09 ENCOUNTER — Other Ambulatory Visit (HOSPITAL_COMMUNITY): Payer: PRIVATE HEALTH INSURANCE

## 2012-10-11 ENCOUNTER — Other Ambulatory Visit (HOSPITAL_COMMUNITY): Payer: PRIVATE HEALTH INSURANCE

## 2012-10-14 ENCOUNTER — Other Ambulatory Visit (HOSPITAL_COMMUNITY): Payer: Self-pay | Admitting: *Deleted

## 2012-10-14 ENCOUNTER — Other Ambulatory Visit (HOSPITAL_COMMUNITY): Payer: PRIVATE HEALTH INSURANCE

## 2012-10-18 ENCOUNTER — Other Ambulatory Visit (HOSPITAL_COMMUNITY): Payer: BC Managed Care – PPO

## 2012-10-21 ENCOUNTER — Other Ambulatory Visit (HOSPITAL_COMMUNITY): Payer: BC Managed Care – PPO

## 2012-10-23 ENCOUNTER — Other Ambulatory Visit (HOSPITAL_COMMUNITY): Payer: BC Managed Care – PPO

## 2012-10-25 ENCOUNTER — Other Ambulatory Visit (HOSPITAL_COMMUNITY): Payer: BC Managed Care – PPO

## 2012-10-28 ENCOUNTER — Other Ambulatory Visit (HOSPITAL_COMMUNITY): Payer: BC Managed Care – PPO

## 2012-10-30 ENCOUNTER — Other Ambulatory Visit (HOSPITAL_COMMUNITY): Payer: BC Managed Care – PPO

## 2012-11-01 ENCOUNTER — Other Ambulatory Visit (HOSPITAL_COMMUNITY): Payer: BC Managed Care – PPO

## 2012-11-04 ENCOUNTER — Other Ambulatory Visit (HOSPITAL_COMMUNITY): Payer: BC Managed Care – PPO

## 2012-11-06 ENCOUNTER — Other Ambulatory Visit (HOSPITAL_COMMUNITY): Payer: BC Managed Care – PPO

## 2012-11-08 ENCOUNTER — Other Ambulatory Visit (HOSPITAL_COMMUNITY): Payer: BC Managed Care – PPO

## 2012-11-11 ENCOUNTER — Other Ambulatory Visit (HOSPITAL_COMMUNITY): Payer: BC Managed Care – PPO

## 2015-05-25 ENCOUNTER — Emergency Department (HOSPITAL_COMMUNITY)
Admission: EM | Admit: 2015-05-25 | Discharge: 2015-05-25 | Disposition: A | Discharge status: Home / Self Care | Payer: PRIVATE HEALTH INSURANCE | Attending: Emergency Medicine | Admitting: Emergency Medicine

## 2015-05-25 ENCOUNTER — Emergency Department (HOSPITAL_COMMUNITY): Payer: PRIVATE HEALTH INSURANCE

## 2015-05-25 ENCOUNTER — Encounter (HOSPITAL_COMMUNITY): Payer: Self-pay | Admitting: Nurse Practitioner

## 2015-05-25 DIAGNOSIS — Z8659 Personal history of other mental and behavioral disorders: Secondary | ICD-10-CM | POA: Insufficient documentation

## 2015-05-25 DIAGNOSIS — Z79899 Other long term (current) drug therapy: Secondary | ICD-10-CM | POA: Insufficient documentation

## 2015-05-25 DIAGNOSIS — Z720 Tobacco use: Secondary | ICD-10-CM | POA: Insufficient documentation

## 2015-05-25 DIAGNOSIS — L02413 Cutaneous abscess of right upper limb: Secondary | ICD-10-CM | POA: Insufficient documentation

## 2015-05-25 DIAGNOSIS — L0291 Cutaneous abscess, unspecified: Secondary | ICD-10-CM

## 2015-05-25 MED ORDER — LIDOCAINE-EPINEPHRINE 1 %-1:100000 IJ SOLN
20.0000 mL | Freq: Once | INTRAMUSCULAR | Status: AC
  Administered 2015-05-25: 20 mL
  Filled 2015-05-25: qty 1

## 2015-05-25 MED ORDER — SULFAMETHOXAZOLE-TRIMETHOPRIM 800-160 MG PO TABS: 1.0000 | ORAL_TABLET | Freq: Two times a day (BID) | ORAL | Status: AC

## 2015-05-25 MED ORDER — SULFAMETHOXAZOLE-TRIMETHOPRIM 800-160 MG PO TABS
1.0000 | ORAL_TABLET | Freq: Once | ORAL | Status: AC
  Administered 2015-05-25: 1 via ORAL
  Filled 2015-05-25: qty 1

## 2015-05-25 NOTE — ED Provider Notes (Signed)
CSN: [id number]   Arrival date & time 05/25/15 1640  History  This chart was scribed for non-physician practitioner, Eyvonne Mechanic PA-C , working with No att. providers found by Bethel Born, ED Scribe. This patient was seen in room TR10C/TR10C and the patient's care was started at 5:04 PM.  Chief Complaint  Patient presents with  . Skin Problem    HPI The history is provided by the patient. No language interpreter was used.   Chris Cook is a 34 y.o. male who presents to the Emergency Department complaining of a worsening area of redness and swelling on the right wrist with onset 4-5 days ago. Pt states that the area started as a small "bump" and  he attempted to open the area yesterday and blood and pus were expressed. Since then the area has significantly worsened.  No fever or chills, dizziness.. Pt works putting up safety signs and barrels but has been out for the last 2 days because of his symptoms. Right hand dominant. No PCP. Tobacco+. Denies illicit drug use.   Past Medical History  Diagnosis Date  . ADHD (attention deficit hyperactivity disorder)     History reviewed. No pertinent past surgical history.  History reviewed. No pertinent family history.  History  Substance Use Topics  . Smoking status: Current Every Day Smoker  . Smokeless tobacco: Not on file  . Alcohol Use: No     Comment: denies     Review of Systems  All other systems reviewed and are negative.   Home Medications   Prior to Admission medications   Medication Sig Start Date End Date Taking? Authorizing Provider  sertraline (ZOLOFT) 50 MG tablet Take 1/2 tablet daily by mouth for 6 days, then take one tablet by mouth daily 09/06/12   Yolande Jolly, PA-C  sulfamethoxazole-trimethoprim (BACTRIM DS,SEPTRA DS) 800-160 MG per tablet Take 1 tablet by mouth 2 (two) times daily. 05/25/15 06/01/15  Eyvonne Mechanic, PA-C    Allergies  Vicodin  Triage Vitals: BP 119/72 mmHg  Pulse 91  Temp(Src) 98.2 F  (36.8 C) (Oral)  Resp 15  SpO2 97%  Physical Exam  Constitutional: He is oriented to person, place, and time. He appears well-developed and well-nourished.  HENT:  Head: Normocephalic.  Eyes: EOM are normal.  Neck: Normal range of motion.  Pulmonary/Chest: Effort normal.  Abdominal: He exhibits no distension.  Musculoskeletal: Normal range of motion.  Obvious abscess to right distal ulnar dorsal aspect of the wrist directly over extensor tendon Minimal surrounding redness Pain with both active and passive flexion and extension No pain with ulnar and radial deviation Radial pulse 2+ Normal sensation and strength in hand and fingers Remainder of upper extremity soft, nontender Small amount of swelling to the hand, no redness  Neurological: He is alert and oriented to person, place, and time.  Psychiatric: He has a normal mood and affect.  Nursing note and vitals reviewed.         ED Course  Procedures   INCISION AND DRAINAGE PROCEDURE NOTE: Patient identification was confirmed and verbal consent was obtained. This procedure was performed by Eyvonne Mechanic PA-C at 7:07 PM. Site: right wrist Sterile procedures observed Needle size: 24 guage Anesthetic used: 4 cc of lidocaine with epinephrine  Blade size: 11 Drainage: 3 cc Complexity: simple Packing used 1 piece placed in cavity Site anesthetized, incision made over site, wound drained and explored loculations, rinsed with copious amounts of normal saline, wound packed with sterile gauze, covered with  dry, sterile dressing.  Pt tolerated procedure well without complications.  Instructions for care discussed verbally and pt provided with additional written instructions for homecare and f/u.   Labs Review- Labs Reviewed - No data to display  Imaging Review Dg Wrist Complete Right  05/25/2015   CLINICAL DATA:  Mass along the ulnar aspect of the wrist for 2 weeks. No known injury.  EXAM: RIGHT WRIST - COMPLETE 3+ VIEW   COMPARISON:  Hand radiographs 11/13/2005.  FINDINGS: There is a soft tissue mass involving the ulnar aspect of the wrist. No calcifications are identified. No underlying bony abnormality. The joint spaces are maintained.  IMPRESSION: Soft tissue mass along the ulnar aspect of the wrist but no underlying bony abnormality.   Electronically Signed   By: Rudie Meyer M.D.   On: 05/25/2015 17:49    EKG Interpretation None      MDM   Final diagnoses:  Abscess     Labs  Imaging: Right wrist XR  Consults  6:33 PM-Consult complete with Dr. Janee Morn (Hand Surgery). Patient case explained and discussed. Call ended at 6:39 PM.   Therapeutics: Bactrim DS, Septra DS Assessment:Plan: Patient presents with an abscess. Due to location above the extensor tendons Dr. Janee Morn with hand surgery was consult. Instructed a longitudinal incision, with packing, antibiotics, follow up in 2 days with primary care or ED for recheck. I&D was completed here in the ED with significant reduction in the swelling to the wrist. No tendon or vessels were visible after I&D, patient was packed, given instructions to keep the wound covered and follow-up in 2 days with urgent care, or the emergency room for reevaluation and packing removal. He was given a dose of antibiotics here in the ED, prescription for oral and about it. Patient verbalizes understanding and agreement today's plan, he verbalized his follow-up evaluation. No signs of significant surrounding cellulitis or swelling, due to the anatomical location some concern for deep space infection. Close follow-up necessary, he verbalizes understanding to this.   I personally performed the services described in this documentation, which was scribed in my presence. The recorded information has been reviewed and is accurate.     Eyvonne Mechanic, PA-C 05/25/15 2239  Glynn Octave, MD 05/26/15 [phone]

## 2015-05-25 NOTE — ED Notes (Signed)
Pt. Left with all belongings and refused wheelchair. Discharge instructions were reviewed and all questions were answered.  

## 2015-05-25 NOTE — ED Notes (Signed)
He noticed a bump on R anterior wrist 5 days ago that has increased in size, pain, redness since. He tried to pop it ysterday and blood and pus came out. No fevers, chills.

## 2015-05-25 NOTE — ED Notes (Signed)
I&D kit at bedside. 

## 2015-05-25 NOTE — Discharge Instructions (Signed)
Abscess An abscess is an infected area that contains a collection of pus and debris.It can occur in almost any part of the body. An abscess is also known as a furuncle or boil. CAUSES  An abscess occurs when tissue gets infected. This can occur from blockage of oil or sweat glands, infection of hair follicles, or a minor injury to the skin. As the body tries to fight the infection, pus collects in the area and creates pressure under the skin. This pressure causes pain. People with weakened immune systems have difficulty fighting infections and get certain abscesses more often.  SYMPTOMS Usually an abscess develops on the skin and becomes a painful mass that is red, warm, and tender. If the abscess forms under the skin, you may feel a moveable soft area under the skin. Some abscesses break open (rupture) on their own, but most will continue to get worse without care. The infection can spread deeper into the body and eventually into the bloodstream, causing you to feel ill.  DIAGNOSIS  Your caregiver will take your medical history and perform a physical exam. A sample of fluid may also be taken from the abscess to determine what is causing your infection. TREATMENT  Your caregiver may prescribe antibiotic medicines to fight the infection. However, taking antibiotics alone usually does not cure an abscess. Your caregiver may need to make a small cut (incision) in the abscess to drain the pus. In some cases, gauze is packed into the abscess to reduce pain and to continue draining the area. HOME CARE INSTRUCTIONS   Only take over-the-counter or prescription medicines for pain, discomfort, or fever as directed by your caregiver.  If you were prescribed antibiotics, take them as directed. Finish them even if you start to feel better.  If gauze is used, follow your caregiver's directions for changing the gauze.  To avoid spreading the infection:  Keep your draining abscess covered with a  bandage.  Wash your hands well.  Do not share personal care items, towels, or whirlpools with others.  Avoid skin contact with others.  Keep your skin and clothes clean around the abscess.  Keep all follow-up appointments as directed by your caregiver. SEEK MEDICAL CARE IF:   You have increased pain, swelling, redness, fluid drainage, or bleeding.  You have muscle aches, chills, or a general ill feeling.  You have a fever. MAKE SURE YOU:   Understand these instructions.  Will watch your condition.  Will get help right away if you are not doing well or get worse. Document Released: 07/12/2005 Document Revised: 04/02/2012 Document Reviewed: 12/15/2011 [facility] Patient Information 2015 [place], [place]. This information is not intended to replace advice given to you by your health care provider. Make sure you discuss any questions you have with your health care provider.  Please monitor for new or worsening signs or symptoms, return immediately if any present. Please keep wound covered and clean, please follow-up in 2 days at [facility] urgent care or the emergency room for further evaluation. Please take antibiotics as directed.

## 2015-05-27 ENCOUNTER — Emergency Department (HOSPITAL_COMMUNITY)
Admission: EM | Admit: 2015-05-27 | Discharge: 2015-05-27 | Disposition: A | Discharge status: Home / Self Care | Payer: PRIVATE HEALTH INSURANCE | Attending: Emergency Medicine | Admitting: Emergency Medicine

## 2015-05-27 ENCOUNTER — Encounter (HOSPITAL_COMMUNITY): Payer: Self-pay | Admitting: *Deleted

## 2015-05-27 DIAGNOSIS — Z8659 Personal history of other mental and behavioral disorders: Secondary | ICD-10-CM | POA: Insufficient documentation

## 2015-05-27 DIAGNOSIS — L02413 Cutaneous abscess of right upper limb: Secondary | ICD-10-CM

## 2015-05-27 DIAGNOSIS — Z720 Tobacco use: Secondary | ICD-10-CM | POA: Insufficient documentation

## 2015-05-27 MED ORDER — LIDOCAINE-EPINEPHRINE (PF) 2 %-1:200000 IJ SOLN
10.0000 mL | Freq: Once | INTRAMUSCULAR | Status: AC
  Administered 2015-05-27: 10 mL via INTRADERMAL
  Filled 2015-05-27: qty 20

## 2015-05-27 NOTE — Discharge Instructions (Signed)
Continue soaks several times a day. Continue antibiotics. Return as needed.    Abscess Care After An abscess (also called a boil or furuncle) is an infected area that contains a collection of pus. Signs and symptoms of an abscess include pain, tenderness, redness, or hardness, or you may feel a moveable soft area under your skin. An abscess can occur anywhere in the body. The infection may spread to surrounding tissues causing cellulitis. A cut (incision) by the surgeon was made over your abscess and the pus was drained out. Gauze may have been packed into the space to provide a drain that will allow the cavity to heal from the inside outwards. The boil may be painful for 5 to 7 days. Most people with a boil do not have high fevers. Your abscess, if seen early, may not have localized, and may not have been lanced. If not, another appointment may be required for this if it does not get better on its own or with medications. HOME CARE INSTRUCTIONS   Only take over-the-counter or prescription medicines for pain, discomfort, or fever as directed by your caregiver.  When you bathe, soak and then remove gauze or iodoform packs at least daily or as directed by your caregiver. You may then wash the wound gently with mild soapy water. Repack with gauze or do as your caregiver directs. SEEK IMMEDIATE MEDICAL CARE IF:   You develop increased pain, swelling, redness, drainage, or bleeding in the wound site.  You develop signs of generalized infection including muscle aches, chills, fever, or a general ill feeling.  An oral temperature above 102 F (38.9 C) develops, not controlled by medication. See your caregiver for a recheck if you develop any of the symptoms described above. If medications (antibiotics) were prescribed, take them as directed. Document Released: 04/20/2005 Document Revised: 12/25/2011 Document Reviewed: 12/16/2007 [facility] Patient Information 2015 [place], [place]. This information is not  intended to replace advice given to you by your health care provider. Make sure you discuss any questions you have with your health care provider.

## 2015-05-27 NOTE — ED Provider Notes (Signed)
CSN: [id number]     Arrival date & time 05/27/15  1026 History   First MD Initiated Contact with Patient 05/27/15 1030     Chief Complaint  Patient presents with  . Wound Check     (Consider location/radiation/quality/duration/timing/severity/associated sxs/prior Treatment) HPI Chris Cook is a 34 y.o. male who presents to emergency department for recheck of an abscess. Patient was seen 2 days ago for an abscess to the right wrist, was incised and drained, placed on antibiotics which he states he is taking. Patient states that packing accidentally came out when he took the dressing off. Patient denies any fever or chills. Patient states that he thinks the abscess looks the same as it was. He denies any worsening of the symptoms. He denies any systemic symptoms. No soaks or other treatments at home.   Past Medical History  Diagnosis Date  . ADHD (attention deficit hyperactivity disorder)    History reviewed. No pertinent past surgical history. History reviewed. No pertinent family history. Social History  Substance Use Topics  . Smoking status: Current Every Day Smoker  . Smokeless tobacco: None  . Alcohol Use: No     Comment: denies    Review of Systems  Constitutional: Negative for fever and chills.  Musculoskeletal: Positive for myalgias.  Skin: Positive for wound.      Allergies  Vicodin  Home Medications   Prior to Admission medications   Medication Sig Start Date End Date Taking? Authorizing Provider  sertraline (ZOLOFT) 50 MG tablet Take 1/2 tablet daily by mouth for 6 days, then take one tablet by mouth daily 09/06/12   Yolande Jolly, PA-C  sulfamethoxazole-trimethoprim (BACTRIM DS,SEPTRA DS) 800-160 MG per tablet Take 1 tablet by mouth 2 (two) times daily. 05/25/15 06/01/15  Jeffrey Hedges, PA-C   BP 108/53 mmHg  Pulse 87  Temp(Src) 98.8 F (37.1 C) (Oral)  Resp 16  Ht 5\' 8"  (1.727 m)  Wt 160 lb (72.576 kg)  BMI 24.33 kg/m2  SpO2 98% Physical Exam   Constitutional: He appears well-developed and well-nourished. No distress.  Cardiovascular: Normal rate, regular rhythm and normal heart sounds.   Pulmonary/Chest: Effort normal and breath sounds normal. No respiratory distress. He has no wheezes. He has no rales.  Musculoskeletal:  Erythema and swelling noted to the ulnar styloid of the right breast. There is a central opening with purulent drainage. Tenderness to palpation. Area of erythema is approximately 4 cm in diameter. A erythema or streaking extending beyond this area. Full range of motion of the wrist and elbow.  Nursing note and vitals reviewed.   ED Course  Procedures (including critical care time) Labs Review Labs Reviewed - No data to display  Imaging Review Dg Wrist Complete Right  05/25/2015   CLINICAL DATA:  Mass along the ulnar aspect of the wrist for 2 weeks. No known injury.  EXAM: RIGHT WRIST - COMPLETE 3+ VIEW  COMPARISON:  Hand radiographs 11/13/2005.  FINDINGS: There is a soft tissue mass involving the ulnar aspect of the wrist. No calcifications are identified. No underlying bony abnormality. The joint spaces are maintained.  IMPRESSION: Soft tissue mass along the ulnar aspect of the wrist but no underlying bony abnormality.   Electronically Signed   By: Rudie Meyer M.D.   On: 05/25/2015 17:49     EKG Interpretation None      INCISION AND DRAINAGE Performed by: Jaynie Crumble A Consent: Verbal consent obtained. Risks and benefits: risks, benefits and alternatives were discussed Type:  abscess  Body area: right wrist  Anesthesia: local infiltration  Incision was made with a scalpel.  Local anesthetic: lidocaine 2% w epinephrine  Anesthetic total: 3 ml  Complexity: complex Blunt dissection to break up loculations  Drainage: purulent  Drainage amount: moderate  Patient tolerance: Patient tolerated the procedure well with no immediate complications.     MDM   Final diagnoses:  Abscess  of right forearm   Pt with right wrist abscess, incised and drained 2 days ago. Pt presents with persistent drainage, but abscess appears better than it was 2 days ago based on the image uploaded. Wound irrigated, soaked, loculations broken up. Home with soaks, continue antibiotics, follow up as needed.   Filed Vitals:   05/27/15 1033 05/27/15 1033 05/27/15 1150  BP: 108/53  92/52  Pulse: 87  75  Temp: 98.8 F (37.1 C)  98.4 F (36.9 C)  TempSrc: Oral  Oral  Resp: 16  18  Height:   (1.727 m)   Weight:  160 lb (72.576 kg)   SpO2: 98%  96%      Jaynie Crumble, PA-C 05/27/15 1540  Vanetta Mulders, MD 06/02/15 1754

## 2015-05-27 NOTE — ED Notes (Signed)
PT presents today for wound check.to RT lateral arm.

## 2015-09-15 ENCOUNTER — Emergency Department (HOSPITAL_COMMUNITY)
Admission: EM | Admit: 2015-09-15 | Discharge: 2015-09-15 | Disposition: A | Discharge status: Home / Self Care | Payer: PRIVATE HEALTH INSURANCE | Attending: Emergency Medicine | Admitting: Emergency Medicine

## 2015-09-15 ENCOUNTER — Encounter (HOSPITAL_COMMUNITY): Payer: Self-pay | Admitting: *Deleted

## 2015-09-15 DIAGNOSIS — F17200 Nicotine dependence, unspecified, uncomplicated: Secondary | ICD-10-CM | POA: Insufficient documentation

## 2015-09-15 DIAGNOSIS — Z8659 Personal history of other mental and behavioral disorders: Secondary | ICD-10-CM | POA: Insufficient documentation

## 2015-09-15 DIAGNOSIS — M71022 Abscess of bursa, left elbow: Secondary | ICD-10-CM | POA: Insufficient documentation

## 2015-09-15 DIAGNOSIS — Z791 Long term (current) use of non-steroidal anti-inflammatories (NSAID): Secondary | ICD-10-CM | POA: Insufficient documentation

## 2015-09-15 MED ORDER — TRAMADOL HCL 50 MG PO TABS: 50.0000 mg | ORAL_TABLET | Freq: Four times a day (QID) | ORAL | Status: AC | PRN

## 2015-09-15 MED ORDER — POVIDONE-IODINE 10 % EX SOLN
CUTANEOUS | Status: AC
  Filled 2015-09-15: qty 118

## 2015-09-15 MED ORDER — SULFAMETHOXAZOLE-TRIMETHOPRIM 800-160 MG PO TABS: 1.0000 | ORAL_TABLET | Freq: Two times a day (BID) | ORAL | Status: AC

## 2015-09-15 MED ORDER — LIDOCAINE HCL (PF) 2 % IJ SOLN
10.0000 mL | Freq: Once | INTRAMUSCULAR | Status: AC
  Administered 2015-09-15: 10 mL
  Filled 2015-09-15: qty 10

## 2015-09-15 NOTE — ED Notes (Signed)
I & D Tray and Lidocaine set up at bedside.

## 2015-09-15 NOTE — ED Notes (Signed)
Pt. C/o abscess to left elbow. Elbow is red/swollen and draining presently. Hx of same.

## 2015-09-15 NOTE — Discharge Instructions (Signed)

## 2015-09-15 NOTE — ED Notes (Signed)
PA Karen Sofia at bedside. 

## 2015-09-15 NOTE — ED Provider Notes (Signed)
CSN: [id number]     Arrival date & time 09/15/15  0906 History   First MD Initiated Contact with Patient 09/15/15 0935     Chief Complaint  Patient presents with  . Abscess     (Consider location/radiation/quality/duration/timing/severity/associated sxs/prior Treatment) Patient is a 34 y.o. male presenting with abscess. The history is provided by the patient. No language interpreter was used.  Abscess Size:  2 Abscess quality: painful, redness and warmth   Red streaking: no   Progression:  Worsening Pain details:    Severity:  Moderate   Timing:  Constant   Progression:  Worsening Chronicity:  New Context: not diabetes   Relieved by:  Nothing Worsened by:  Nothing tried Ineffective treatments:  None tried Associated symptoms: no vomiting     Past Medical History  Diagnosis Date  . ADHD (attention deficit hyperactivity disorder)    History reviewed. No pertinent past surgical history. History reviewed. No pertinent family history. Social History  Substance Use Topics  . Smoking status: Current Every Day Smoker  . Smokeless tobacco: None  . Alcohol Use: No     Comment: denies    Review of Systems  Gastrointestinal: Negative for vomiting.  Musculoskeletal: Positive for joint swelling.  All other systems reviewed and are negative.     Allergies  Vicodin  Home Medications   Prior to Admission medications   Medication Sig Start Date End Date Taking? Authorizing Provider  naproxen sodium (ANAPROX) 220 MG tablet Take 440 mg by mouth 2 (two) times daily with a meal.   Yes Historical Provider, MD  sertraline (ZOLOFT) 50 MG tablet Take 1/2 tablet daily by mouth for 6 days, then take one tablet by mouth daily Patient not taking: Reported on 09/15/2015 09/06/12   Yolande JollyAlan H Watt, PA-C  sulfamethoxazole-trimethoprim (BACTRIM DS,SEPTRA DS) 800-160 MG tablet Take 1 tablet by mouth 2 (two) times daily. 09/15/15 09/22/15  Elson AreasLeslie K Sofia, PA-C  traMADol (ULTRAM) 50 MG tablet  Take 1 tablet (50 mg total) by mouth every 6 (six) hours as needed. 09/15/15   Lonia SkinnerLeslie K Sofia, PA-C   BP 120/76 mmHg  Pulse 81  Temp(Src) 98.5 F (36.9 C) (Oral)  Resp 16  Ht 5\' 8"  (1.727 m)  Wt 72.576 kg  BMI 24.33 kg/m2  SpO2 98% Physical Exam  Constitutional: He appears well-developed and well-nourished.  HENT:  Head: Normocephalic.  Musculoskeletal: He exhibits tenderness.  2cm swollen area left elbow. Warm to touch  Neurological: He is alert.  Skin: Skin is warm.  Psychiatric: He has a normal mood and affect.  Nursing note and vitals reviewed.   ED Course  .Marland Kitchen.Incision and Drainage Date/Time: 09/15/2015 3:11 PM Performed by: Elson AreasSOFIA, LESLIE K Authorized by: Elson AreasSOFIA, LESLIE K Consent: Verbal consent not obtained. Risks and benefits: risks, benefits and alternatives were discussed Consent given by: patient Patient identity confirmed: verbally with patient Type: abscess Body area: upper extremity Anesthesia: local infiltration Local anesthetic: lidocaine 2% without epinephrine Patient sedated: no Scalpel size: 11 Incision type: single straight Complexity: simple Drainage: purulent Drainage amount: copious Wound treatment: wound left open Patient tolerance: Patient tolerated the procedure well with no immediate complications   (including critical care time) Labs Review Labs Reviewed  CULTURE, ROUTINE-ABSCESS    Imaging Review No results found. I have personally reviewed and evaluated these images and lab results as part of my medical decision-making.   EKG Interpretation None      MDM Dr. Adriana Simasook in to see   Final diagnoses:  Abscess  of bursa of elbow, left    Bactrim Tramadol Return in 2 days for recheck    Elson Areas, PA-C 09/15/15 1518  Donnetta Hutching, MD 09/16/15 [phone]

## 2015-09-17 ENCOUNTER — Encounter (HOSPITAL_COMMUNITY): Payer: Self-pay

## 2015-09-17 ENCOUNTER — Emergency Department (HOSPITAL_COMMUNITY)
Admission: EM | Admit: 2015-09-17 | Discharge: 2015-09-17 | Disposition: A | Discharge status: Home / Self Care | Payer: PRIVATE HEALTH INSURANCE | Attending: Emergency Medicine | Admitting: Emergency Medicine

## 2015-09-17 DIAGNOSIS — Z09 Encounter for follow-up examination after completed treatment for conditions other than malignant neoplasm: Secondary | ICD-10-CM

## 2015-09-17 DIAGNOSIS — Z4801 Encounter for change or removal of surgical wound dressing: Secondary | ICD-10-CM | POA: Insufficient documentation

## 2015-09-17 DIAGNOSIS — Z791 Long term (current) use of non-steroidal anti-inflammatories (NSAID): Secondary | ICD-10-CM | POA: Insufficient documentation

## 2015-09-17 DIAGNOSIS — F17200 Nicotine dependence, unspecified, uncomplicated: Secondary | ICD-10-CM | POA: Insufficient documentation

## 2015-09-17 DIAGNOSIS — Z8659 Personal history of other mental and behavioral disorders: Secondary | ICD-10-CM | POA: Insufficient documentation

## 2015-09-17 DIAGNOSIS — Z79899 Other long term (current) drug therapy: Secondary | ICD-10-CM | POA: Insufficient documentation

## 2015-09-17 MED ORDER — SULFAMETHOXAZOLE-TRIMETHOPRIM 800-160 MG PO TABS: 1.0000 | ORAL_TABLET | Freq: Two times a day (BID) | ORAL | Status: AC

## 2015-09-17 NOTE — ED Notes (Signed)
Pt reports had i and d of abscess on left elbow Wednesday.  Here today for a recheck.

## 2015-09-17 NOTE — Discharge Instructions (Signed)
Wound Care Taking care of your wound properly can help to prevent pain and infection. It can also help your wound to heal more quickly.  HOW TO CARE FOR YOUR WOUND  Take or apply over-the-counter and prescription medicines only as told by your health care provider.  If you were prescribed antibiotic medicine, take or apply it as told by your health care provider. Do not stop using the antibiotic even if your condition improves.  Clean the wound each day or as told by your health care provider.  Soak your elbow in warm epsom salt water for 10 minutes twice daily, followed by washing.  Wash the wound with mild soap and water.  Rinse the wound with water to remove all soap.  Pat the wound dry with a clean towel then cover the site (if it risks contamination).  There are many different ways to close and cover a wound. For example, a wound can be covered with stitches (sutures), skin glue, or adhesive strips. Follow instructions from your health care provider about:  How to take care of your wound.  When and how you should change your bandage (dressing).  When you should remove your dressing.  Removing whatever was used to close your wound.  Check your wound every day for signs of infection. Watch for:  Redness, swelling, or pain.  Fluid, blood, or pus.  Keep the dressing dry until your health care provider says it can be removed. Do not take baths, swim, use a hot tub, or do anything that would put your wound underwater until your health care provider approves.  Raise (elevate) the injured area above the level of your heart while you are sitting or lying down.  Do not scratch or pick at the wound.  Keep all follow-up visits as told by your health care provider. This is important. SEEK MEDICAL CARE IF:  You received a tetanus shot and you have swelling, severe pain, redness, or bleeding at the injection site.  You have a fever.  Your pain is not controlled with  medicine.  You have increased redness, swelling, or pain at the site of your wound.  You have fluid, blood, or pus coming from your wound.  You notice a bad smell coming from your wound or your dressing. SEEK IMMEDIATE MEDICAL CARE IF:  You have a red streak going away from your wound.   This information is not intended to replace advice given to you by your health care provider. Make sure you discuss any questions you have with your health care provider.   Document Released: 07/11/2008 Document Revised: 02/16/2015 Document Reviewed: 09/28/2014 [employer] Interactive Patient Education [employer]2016 [employer]

## 2015-09-19 LAB — CULTURE, ROUTINE-ABSCESS

## 2015-09-19 NOTE — ED Provider Notes (Signed)
CSN: [id number]     Arrival date & time 09/17/15  1006 History   First MD Initiated Contact with Patient 09/17/15 1012     Chief Complaint  Patient presents with  . Wound Check     (Consider location/radiation/quality/duration/timing/severity/associated sxs/prior Treatment) The history is provided by the patient.   Chris Cook is a 34 y.o. male presenting for recheck of his abscess and cellulitis site at his left elbow.  He was seen here 2 days ago where he had an I&D procedure performed and was placed on Bactrim which she has been taking and endorses reduction of the area of erythema.  He has changed his bandages daily, endorsing bloody drainage yesterday, trace of drainage today.  Overall, his elbow feels less painful and is significantly less swollen per patient report.  He denies fevers or chills, no other complaints.    Past Medical History  Diagnosis Date  . ADHD (attention deficit hyperactivity disorder)    History reviewed. No pertinent past surgical history. No family history on file. Social History  Substance Use Topics  . Smoking status: Current Every Day Smoker  . Smokeless tobacco: None  . Alcohol Use: No     Comment: denies    Review of Systems  Constitutional: Negative for fever and chills.  Gastrointestinal: Negative.   Skin:       Negative except as mentioned in HPI.    Neurological: Negative for weakness and numbness.      Allergies  Vicodin  Home Medications   Prior to Admission medications   Medication Sig Start Date End Date Taking? Authorizing Provider  naproxen sodium (ANAPROX) 220 MG tablet Take 440 mg by mouth 2 (two) times daily with a meal.   Yes Historical Provider, MD  sulfamethoxazole-trimethoprim (BACTRIM DS,SEPTRA DS) 800-160 MG tablet Take 1 tablet by mouth 2 (two) times daily. 09/15/15 09/22/15 Yes Lonia Skinner Sofia, PA-C  traMADol (ULTRAM) 50 MG tablet Take 1 tablet (50 mg total) by mouth every 6 (six) hours as needed. 09/15/15   Yes Lonia Skinner Sofia, PA-C  sertraline (ZOLOFT) 50 MG tablet Take 1/2 tablet daily by mouth for 6 days, then take one tablet by mouth daily Patient not taking: Reported on 09/15/2015 09/06/12   Yolande Jolly, PA-C  sulfamethoxazole-trimethoprim (BACTRIM DS,SEPTRA DS) 800-160 MG tablet Take 1 tablet by mouth 2 (two) times daily. 09/17/15 09/24/15  Burgess Amor, PA-C   BP 122/68 mmHg  Pulse 70  Temp(Src) 98.1 F (36.7 C) (Oral)  Resp 18  Ht  (1.727 m)  Wt 72.576 kg  BMI 24.33 kg/m2  SpO2 98% Physical Exam  Constitutional: He appears well-developed and well-nourished. No distress.  HENT:  Head: Normocephalic.  Neck: Neck supple.  Cardiovascular: Normal rate.   Pulmonary/Chest: Effort normal. He has no wheezes.  Musculoskeletal: Normal range of motion. He exhibits no edema.  Skin: There is erythema.  No edema noted at left elbow olecranon.  Open I&D without drainage.  He does have a 2 cm surrounding area of erythema without red streaking.    ED Course  Procedures (including critical care time) Labs Review Labs Reviewed - No data to display  Imaging Review No results found. I have personally reviewed and evaluated these images and lab results as part of my medical decision-making.   EKG Interpretation None      MDM   Final diagnoses:  Encounter for recheck of abscess following incision and drainage    Patient was encouraged to start using warm  water soaks twice daily and to complete his antibiotic course.  He was given an additional 3 day prescription of his Bactrim for a total 10 day treatment.  Advised when necessary follow-up.  Discussed need to return here for any spreading of the redness, increasing pain or swelling.      Burgess AmorJulie Vishruth Seoane, PA-C 09/19/15 [id number]  Raeford RazorStephen Kohut, MD 09/23/15 1425

## 2015-09-20 ENCOUNTER — Telehealth (HOSPITAL_COMMUNITY): Payer: Self-pay

## 2015-09-20 NOTE — Telephone Encounter (Signed)
Post ED Visit - Positive Culture Follow-up  Culture report reviewed by antimicrobial stewardship pharmacist:  []  Enzo BiNathan Batchelder, Pharm.D. [x]  Celedonio MiyamotoJeremy Frens, Pharm.D., BCPS []  Garvin FilaMike Maccia, Pharm.D. []  Georgina PillionElizabeth Martin, Pharm.D., BCPS []  McLouthMinh Pham, VermontPharm.D., BCPS, AAHIVP []  Estella HuskMichelle Turner, Pharm.D., BCPS, AAHIVP []  Tennis Mustassie Stewart, Pharm.D. []  Sherle Poeob Vincent, [address]Pharm[address]  Positive Abscess culture, Moderate MRSA Treated with Sulfa Trimeth, organism sensitive to the same and no further patient follow-up is required at this time. LVM requesting callback to inform pt of dx.     Arvid RightClark, Lewin Pellow Dorn 09/20/2015, 8:59 AM

## 2015-09-21 ENCOUNTER — Telehealth (HOSPITAL_BASED_OUTPATIENT_CLINIC_OR_DEPARTMENT_OTHER): Payer: Self-pay | Admitting: Emergency Medicine

## 2015-09-22 ENCOUNTER — Telehealth (HOSPITAL_BASED_OUTPATIENT_CLINIC_OR_DEPARTMENT_OTHER): Payer: Self-pay | Admitting: Emergency Medicine

## 2015-09-22 ENCOUNTER — Telehealth: Payer: Self-pay | Admitting: Emergency Medicine

## 2015-09-27 ENCOUNTER — Telehealth (HOSPITAL_COMMUNITY): Payer: Self-pay

## 2015-10-03 ENCOUNTER — Telehealth (HOSPITAL_COMMUNITY): Payer: Self-pay

## 2015-10-03 NOTE — Telephone Encounter (Signed)
Unable to contact pt by mail or telephone. Unable to communicate lab results or treatment changes. 

## 2016-05-03 IMAGING — CR DG WRIST COMPLETE 3+V*R*
3 series · 3 of 3 positions shown · non-contrast
Comparison: Hand radiographs 11/13/2005.

CLINICAL DATA: Mass along the ulnar aspect of the wrist for 2
weeks. No known injury.

EXAM:
RIGHT WRIST - COMPLETE 3+ VIEW

[wrist pa]
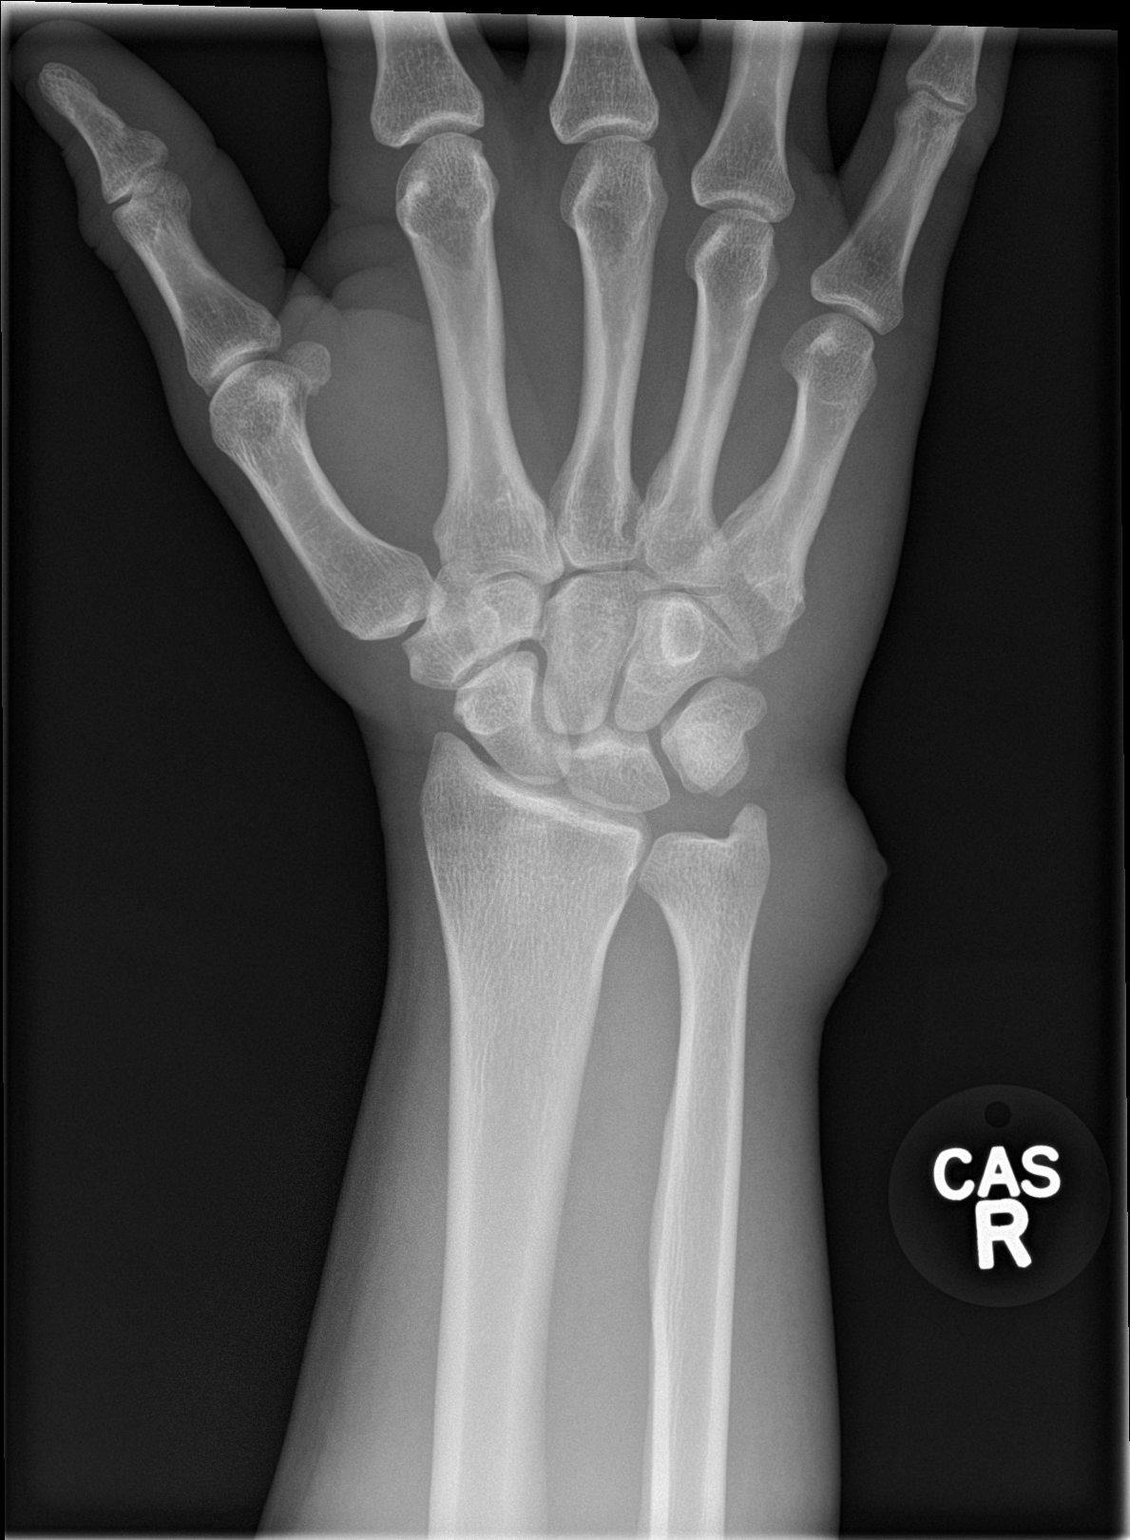

[wrist obl]
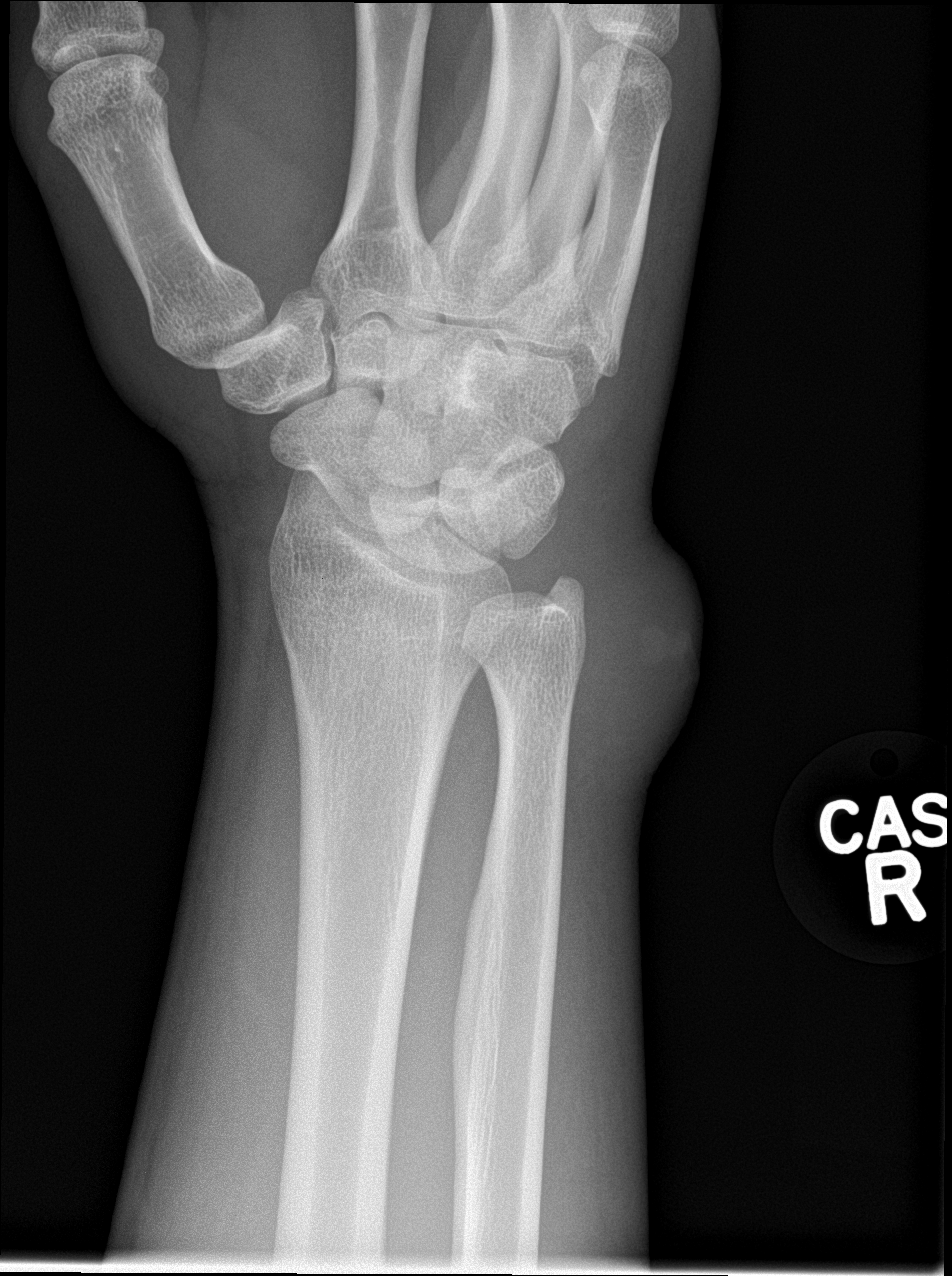

[wrist lat]
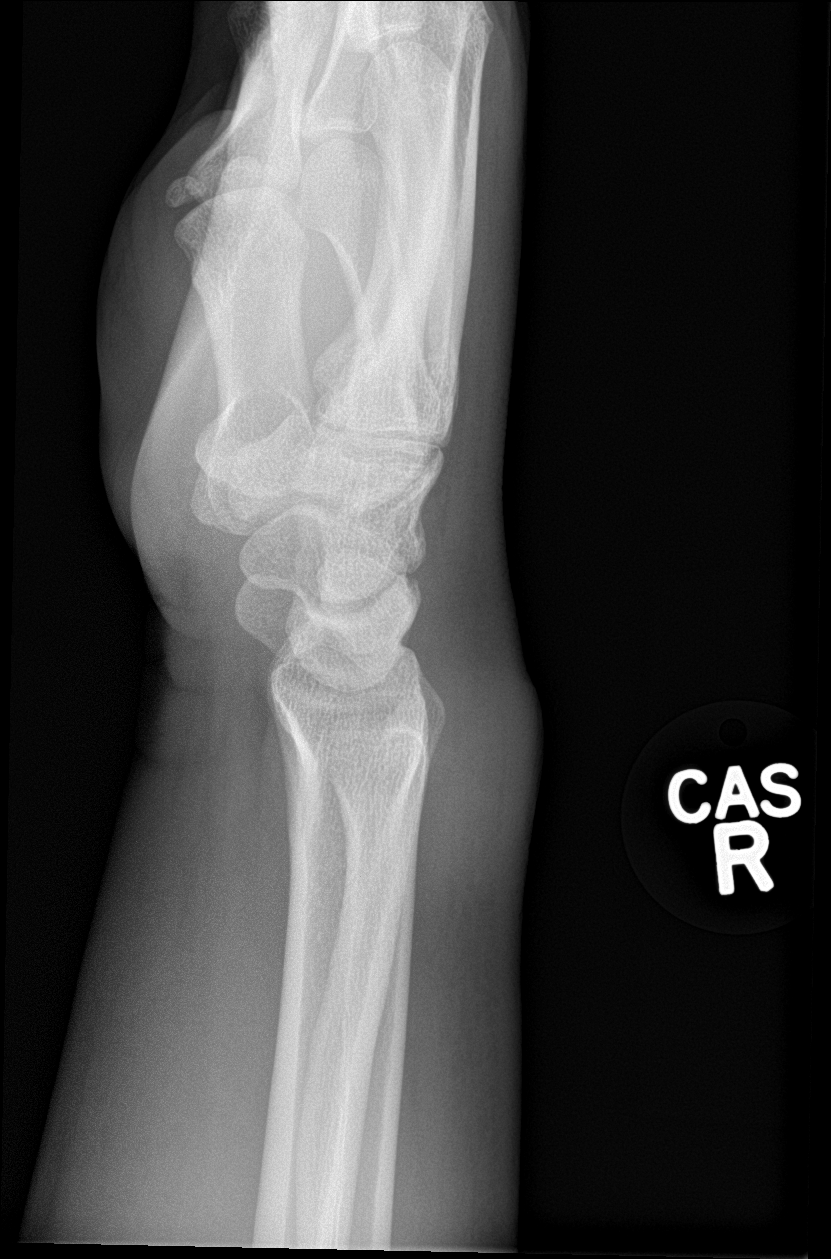

[3 of 3 positions shown; findings below may reference images not displayed]

FINDINGS: There is a soft tissue mass involving the ulnar aspect of the wrist.
No calcifications are identified. No underlying bony abnormality.
The joint spaces are maintained.
IMPRESSION: Soft tissue mass along the ulnar aspect of the wrist but no
underlying bony abnormality.
